# Patient Record
Sex: Female | Born: 1950 | Race: White | Hispanic: No | Smoking: Never smoker
Health system: Southern US, Community
[De-identification: ages and names within clinical notes are randomized; demographics above are authoritative.]

---

## 2018-04-20 ENCOUNTER — Other Ambulatory Visit: Payer: Self-pay | Admitting: Internal Medicine

## 2018-04-20 DIAGNOSIS — M810 Age-related osteoporosis without current pathological fracture: Secondary | ICD-10-CM

## 2018-06-21 ENCOUNTER — Other Ambulatory Visit: Payer: Self-pay

## 2019-03-29 ENCOUNTER — Other Ambulatory Visit: Payer: Self-pay | Admitting: Internal Medicine

## 2019-03-29 DIAGNOSIS — Z780 Asymptomatic menopausal state: Secondary | ICD-10-CM

## 2019-04-02 ENCOUNTER — Other Ambulatory Visit: Payer: Self-pay

## 2019-04-02 ENCOUNTER — Ambulatory Visit
Admission: RE | Admit: 2019-04-02 | Discharge: 2019-04-02 | Disposition: A | Discharge status: Home / Self Care | Payer: Self-pay | Source: Ambulatory Visit | Attending: Internal Medicine | Admitting: Internal Medicine

## 2019-04-02 DIAGNOSIS — Z780 Asymptomatic menopausal state: Secondary | ICD-10-CM

## 2019-07-22 ENCOUNTER — Other Ambulatory Visit: Payer: Self-pay

## 2019-07-22 ENCOUNTER — Emergency Department (HOSPITAL_COMMUNITY)
Admission: EM | Admit: 2019-07-22 | Discharge: 2019-07-22 | Disposition: A | Discharge status: Home / Self Care | Payer: Medicare HMO | Attending: Emergency Medicine | Admitting: Emergency Medicine

## 2019-07-22 DIAGNOSIS — H1131 Conjunctival hemorrhage, right eye: Secondary | ICD-10-CM | POA: Diagnosis not present

## 2019-07-22 DIAGNOSIS — H5789 Other specified disorders of eye and adnexa: Secondary | ICD-10-CM | POA: Diagnosis present

## 2019-07-22 NOTE — Discharge Instructions (Addendum)
You were evaluated in the Emergency Department and after careful evaluation, we did not find any emergent condition requiring admission or further testing in the hospital.  Your exam/testing today is overall reassuring.  Your symptoms seem to be due to a subconjunctival hemorrhage, which is not dangerous and will resolve on its own.  Please return to the Emergency Department if you experience any worsening of your condition.  We encourage you to follow up with a primary care provider.  Thank you for allowing [place] to be a part of your care.

## 2019-07-22 NOTE — ED Provider Notes (Signed)
[facility]-EMERGENCY DEPT [facility] Emergency Department Provider Note MRN:  [id number]  Arrival date & time: 07/22/19     Chief Complaint   right eye injury   History of Present Illness   Alexis Morrow is a 69 y.o. year-old female with no pertinent past medical history presenting to the ED with chief complaint of right eye injury.  Patient woke up from a nap and looked in the mirror and noticed that her right eye was red and bloody.  No vision loss, no eye pain, no fever, no trauma, no other complaints.  She is recovering from a tummy tuck procedure that she had done earlier today.  Mildly sore but in general doing well.  Review of Systems  A complete 10 system review of systems was obtained and all systems are negative except as noted in the HPI and PMH.   Patient's Health History   No past medical history on file.    No family history on file.  Social History   Socioeconomic History  . Marital status: Divorced    Spouse name: Not on file  . Number of children: Not on file  . Years of education: Not on file  . Highest education level: Not on file  Occupational History  . Not on file  Tobacco Use  . Smoking status: Not on file  Substance and Sexual Activity  . Alcohol use: Not on file  . Drug use: Not on file  . Sexual activity: Not on file  Other Topics Concern  . Not on file  Social History Narrative  . Not on file   Social Determinants of Health   Financial Resource Strain:   . Difficulty of Paying Living Expenses:   Food Insecurity:   . Worried About Programme researcher, broadcasting/film/video in the Last Year:   . Barista in the Last Year:   Transportation Needs:   . Freight forwarder (Medical):   Marland Kitchen Lack of Transportation (Non-Medical):   Physical Activity:   . Days of Exercise per Week:   . Minutes of Exercise per Session:   Stress:   . Feeling of Stress :   Social Connections:   . Frequency of Communication with Friends and Family:   . Frequency of Social  Gatherings with Friends and Family:   . Attends Religious Services:   . Active Member of Clubs or Organizations:   . Attends Banker Meetings:   Marland Kitchen Marital Status:   Intimate Partner Violence:   . Fear of Current or Ex-Partner:   . Emotionally Abused:   Marland Kitchen Physically Abused:   . Sexually Abused:      Physical Exam   Vitals:   07/22/19 1721  BP: (!) 154/94  Pulse: 97  Resp: 16  SpO2: 98%    CONSTITUTIONAL: Well-appearing, NAD NEURO:  Alert and oriented x 3, no focal deficits EYES:  eyes equal and reactive, normal visual acuity, medial aspect of right conjunctiva is dark red with hemorrhage ENT/NECK:  no LAD, no JVD CARDIO: Regular rate, well-perfused, normal S1 and S2 PULM:  CTAB no wheezing or rhonchi GI/GU:  normal bowel sounds, non-distended, non-tender MSK/SPINE:  No gross deformities, no edema SKIN:  no rash, atraumatic PSYCH:  Appropriate speech and behavior  *Additional and/or pertinent findings included in MDM below  Diagnostic and Interventional Summary    EKG Interpretation  Date/Time:    Ventricular Rate:    PR Interval:    QRS Duration:   QT Interval:  QTC Calculation:   R Axis:     Text Interpretation:        Labs Reviewed - No data to display  No orders to display    Medications - No data to display   Procedures  /  Critical Care Procedures  ED Course and Medical Decision Making  I have reviewed the triage vital signs, the nursing notes, and pertinent available records from the EMR.  Listed above are laboratory and imaging tests that I personally ordered, reviewed, and interpreted and then considered in my medical decision making (see below for details).      Exam consistent with uncomplicated some subconjunctival hemorrhage.  No vision loss, normal extraocular movements, reactive pupils.  Patient is also recovering from tummy tuck procedure earlier today, wound seems to be healing well, will be following up with her surgeon  within the next few days.  Appropriate for discharge.    Barth Kirks. Sedonia Small, [place] [email or web address]  Final Clinical Impressions(s) / ED Diagnoses     ICD-10-CM   1. Subconjunctival hemorrhage of right eye  H11.31     ED Discharge Orders    None       Discharge Instructions Discussed with and Provided to Patient:     Discharge Instructions     You were evaluated in the Emergency Department and after careful evaluation, we did not find any emergent condition requiring admission or further testing in the hospital.  Your exam/testing today is overall reassuring.  Your symptoms seem to be due to a subconjunctival hemorrhage, which is not dangerous and will resolve on its own.  Please return to the Emergency Department if you experience any worsening of your condition.  We encourage you to follow up with a primary care provider.  Thank you for allowing [place] to be a part of your care.      Maudie Flakes, MD 07/22/19 [phone]

## 2019-07-22 NOTE — ED Triage Notes (Signed)
Per patient, she looked in the mirror around 1700 and noticed her right eye was swollen.  Patient says eye is not bothering her and she can see fine.

## 2021-03-03 DIAGNOSIS — R070 Pain in throat: Secondary | ICD-10-CM | POA: Diagnosis not present

## 2021-03-03 DIAGNOSIS — K219 Gastro-esophageal reflux disease without esophagitis: Secondary | ICD-10-CM | POA: Diagnosis not present

## 2021-03-03 DIAGNOSIS — R053 Chronic cough: Secondary | ICD-10-CM | POA: Diagnosis not present

## 2021-06-11 DIAGNOSIS — H903 Sensorineural hearing loss, bilateral: Secondary | ICD-10-CM | POA: Diagnosis not present

## 2021-06-11 DIAGNOSIS — K219 Gastro-esophageal reflux disease without esophagitis: Secondary | ICD-10-CM | POA: Diagnosis not present

## 2021-06-11 DIAGNOSIS — R070 Pain in throat: Secondary | ICD-10-CM | POA: Diagnosis not present

## 2021-07-10 DIAGNOSIS — T8543XA Leakage of breast prosthesis and implant, initial encounter: Secondary | ICD-10-CM | POA: Diagnosis not present

## 2021-07-10 DIAGNOSIS — T8544XA Capsular contracture of breast implant, initial encounter: Secondary | ICD-10-CM | POA: Diagnosis not present

## 2021-07-10 DIAGNOSIS — F419 Anxiety disorder, unspecified: Secondary | ICD-10-CM | POA: Diagnosis not present

## 2021-07-10 DIAGNOSIS — L905 Scar conditions and fibrosis of skin: Secondary | ICD-10-CM | POA: Diagnosis not present

## 2021-07-10 DIAGNOSIS — Z9882 Breast implant status: Secondary | ICD-10-CM | POA: Diagnosis not present

## 2021-07-10 DIAGNOSIS — Z9013 Acquired absence of bilateral breasts and nipples: Secondary | ICD-10-CM | POA: Diagnosis not present

## 2021-07-10 DIAGNOSIS — N650 Deformity of reconstructed breast: Secondary | ICD-10-CM | POA: Diagnosis not present

## 2021-07-10 DIAGNOSIS — T8541XA Breakdown (mechanical) of breast prosthesis and implant, initial encounter: Secondary | ICD-10-CM | POA: Diagnosis not present

## 2021-07-10 DIAGNOSIS — Z853 Personal history of malignant neoplasm of breast: Secondary | ICD-10-CM | POA: Diagnosis not present

## 2021-10-01 DIAGNOSIS — R070 Pain in throat: Secondary | ICD-10-CM | POA: Diagnosis not present

## 2021-10-01 DIAGNOSIS — K219 Gastro-esophageal reflux disease without esophagitis: Secondary | ICD-10-CM | POA: Diagnosis not present

## 2021-11-03 DIAGNOSIS — Z1509 Genetic susceptibility to other malignant neoplasm: Secondary | ICD-10-CM | POA: Diagnosis not present

## 2021-11-03 DIAGNOSIS — F3342 Major depressive disorder, recurrent, in full remission: Secondary | ICD-10-CM | POA: Diagnosis not present

## 2021-11-03 DIAGNOSIS — E7800 Pure hypercholesterolemia, unspecified: Secondary | ICD-10-CM | POA: Diagnosis not present

## 2021-11-03 DIAGNOSIS — Z1501 Genetic susceptibility to malignant neoplasm of breast: Secondary | ICD-10-CM | POA: Diagnosis not present

## 2021-11-13 DIAGNOSIS — H40003 Preglaucoma, unspecified, bilateral: Secondary | ICD-10-CM | POA: Diagnosis not present

## 2021-11-13 DIAGNOSIS — H59812 Chorioretinal scars after surgery for detachment, left eye: Secondary | ICD-10-CM | POA: Diagnosis not present

## 2021-11-13 DIAGNOSIS — H02834 Dermatochalasis of left upper eyelid: Secondary | ICD-10-CM | POA: Diagnosis not present

## 2021-11-13 DIAGNOSIS — H02831 Dermatochalasis of right upper eyelid: Secondary | ICD-10-CM | POA: Diagnosis not present

## 2021-11-30 DIAGNOSIS — H02831 Dermatochalasis of right upper eyelid: Secondary | ICD-10-CM | POA: Diagnosis not present

## 2021-11-30 DIAGNOSIS — H02834 Dermatochalasis of left upper eyelid: Secondary | ICD-10-CM | POA: Diagnosis not present

## 2021-11-30 DIAGNOSIS — H40003 Preglaucoma, unspecified, bilateral: Secondary | ICD-10-CM | POA: Diagnosis not present

## 2021-12-04 ENCOUNTER — Emergency Department (HOSPITAL_BASED_OUTPATIENT_CLINIC_OR_DEPARTMENT_OTHER)
Admission: EM | Admit: 2021-12-04 | Discharge: 2021-12-04 | Disposition: A | Discharge status: Home / Self Care | Payer: Medicare (Managed Care) | Attending: Emergency Medicine | Admitting: Emergency Medicine

## 2021-12-04 ENCOUNTER — Other Ambulatory Visit: Payer: Self-pay

## 2021-12-04 ENCOUNTER — Other Ambulatory Visit (HOSPITAL_BASED_OUTPATIENT_CLINIC_OR_DEPARTMENT_OTHER): Payer: Self-pay

## 2021-12-04 ENCOUNTER — Encounter (HOSPITAL_BASED_OUTPATIENT_CLINIC_OR_DEPARTMENT_OTHER): Payer: Self-pay

## 2021-12-04 DIAGNOSIS — R42 Dizziness and giddiness: Secondary | ICD-10-CM | POA: Diagnosis not present

## 2021-12-04 LAB — CBC WITH DIFFERENTIAL/PLATELET
Abs Immature Granulocytes: 0.01 10*3/uL (ref 0.00–0.07)
Basophils Absolute: 0 10*3/uL (ref 0.0–0.1)
Basophils Relative: 1 %
Eosinophils Absolute: 0.2 10*3/uL (ref 0.0–0.5)
Eosinophils Relative: 4 %
HCT: 42.2 % (ref 36.0–46.0)
Hemoglobin: 14.4 g/dL (ref 12.0–15.0)
Immature Granulocytes: 0 %
Lymphocytes Relative: 25 %
Lymphs Abs: 1.3 10*3/uL (ref 0.7–4.0)
MCH: 30.6 pg (ref 26.0–34.0)
MCHC: 34.1 g/dL (ref 30.0–36.0)
MCV: 89.6 fL (ref 80.0–100.0)
Monocytes Absolute: 0.4 10*3/uL (ref 0.1–1.0)
Monocytes Relative: 8 %
Neutro Abs: 3.2 10*3/uL (ref 1.7–7.7)
Neutrophils Relative %: 62 %
Platelets: 272 10*3/uL (ref 150–400)
RBC: 4.71 MIL/uL (ref 3.87–5.11)
RDW: 12.7 % (ref 11.5–15.5)
WBC: 5.1 10*3/uL (ref 4.0–10.5)
nRBC: 0 % (ref 0.0–0.2)

## 2021-12-04 LAB — BASIC METABOLIC PANEL
Anion gap: 10 (ref 5–15)
BUN: 16 mg/dL (ref 8–23)
CO2: 24 mmol/L (ref 22–32)
Calcium: 9.9 mg/dL (ref 8.9–10.3)
Chloride: 104 mmol/L (ref 98–111)
Creatinine, Ser: 0.84 mg/dL (ref 0.44–1.00)
GFR, Estimated: >60 mL/min (ref >60)
Glucose, Bld: 98 mg/dL (ref 70–99)
Potassium: 4.1 mmol/L (ref 3.5–5.1)
Sodium: 138 mmol/L (ref 135–145)

## 2021-12-04 LAB — MAGNESIUM: Magnesium: 2 mg/dL (ref 1.7–2.4)

## 2021-12-04 NOTE — ED Triage Notes (Signed)
Pt states she has felt dizzy since Wednesday aprox 5:10 PM. States her blood pressure has been elevated since as well. Does not endorse medication for BP.

## 2021-12-04 NOTE — Discharge Instructions (Signed)
You were seen in the emergency department for your dizziness.  You had no signs of abnormal heart rhythms, your electrolytes and kidney function were normal, you are not anemic and had no signs of stroke on your exam.  It is unclear what is causing your dizziness at this time but you should follow-up with your primary doctor to have your symptoms rechecked.  You should return to the emergency department if your dizziness is getting worse and you pass out, you have chest pain with your dizziness, you have numbness or weakness on one side of the body compared to the other or if you have any other new or concerning symptoms.

## 2021-12-04 NOTE — ED Provider Notes (Signed)
[facility] EMERGENCY DEPT Provider Note   CSN: [id number] Arrival date & time: 12/04/21  1024     History  Chief Complaint  Patient presents with   Dizziness    Alexis Morrow is a 71 y.o. female.  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  The patient states that she was at yoga on Wednesday and when she change positions she became lightheaded and dizzy and her vision went black and she felt like she was about to pass out.  She states that she sat down for a few minutes and the symptoms eventually resolved.  She states that since then she has had on and off symptoms, mostly while up and ambulating.  She denies any associated chest pain or shortness of breath, numbness or tingling.  Denies any recent nausea, vomiting or diarrhea, black or bloody stools, fevers or chills, dysuria, hematuria or increased urinary frequency or urgency.  The history is provided by the patient.  Dizziness      Home Medications Prior to Admission medications   Not on File      Allergies    Patient has no allergy information on record.    Review of Systems   Review of Systems  Neurological:  Positive for dizziness.    Physical Exam Updated Vital Signs BP 132/81   Pulse 81   Temp 97.9 F (36.6 C) (Oral)   Resp 15   Ht 5\' 4"  (1.626 m)   Wt 69.9 kg   SpO2 97%   BMI 26.43 kg/m  Physical Exam Vitals and nursing note reviewed.  Constitutional:      General: She is not in acute distress.    Appearance: Normal appearance.  HENT:     Head: Normocephalic and atraumatic.     Nose: Nose normal.     Mouth/Throat:     Mouth: Mucous membranes are moist.     Pharynx: Oropharynx is clear.  Eyes:     Extraocular Movements: Extraocular movements intact.     Conjunctiva/sclera: Conjunctivae normal.     Pupils: Pupils are equal, round, and reactive to  light.     Comments: No nystagmus  Cardiovascular:     Rate and Rhythm: Normal rate and regular rhythm.     Pulses: Normal pulses.     Heart sounds: Normal heart sounds.  Pulmonary:     Effort: Pulmonary effort is normal.     Breath sounds: Normal breath sounds.  Abdominal:     General: Abdomen is flat.     Palpations: Abdomen is soft.     Tenderness: There is no abdominal tenderness.  Musculoskeletal:        General: Normal range of motion.     Cervical back: Normal range of motion and neck supple.     Right lower leg: No edema.     Left lower leg: No edema.  Skin:    General: Skin is warm and dry.  Neurological:     General: No focal deficit present.     Mental Status: She is alert and oriented to person, place, and time.     Cranial Nerves: No cranial nerve deficit.     Sensory: No sensory deficit.     Motor: No weakness.     Coordination: Coordination normal.  Psychiatric:        Mood and Affect: Mood normal.  ED Results / Procedures / Treatments   Labs (all labs ordered are listed, but only abnormal results are displayed) Labs Reviewed  BASIC METABOLIC PANEL  CBC WITH DIFFERENTIAL/PLATELET  MAGNESIUM    EKG EKG Interpretation  Date/Time:  Friday December 04 2021 11:26:35 EDT Ventricular Rate:  71 PR Interval:  163 QRS Duration: 96 QT Interval:  385 QTC Calculation: 419 R Axis:   40 Text Interpretation: Sinus rhythm RSR' in V1 or V2, right VCD or RVH No previous ECGs available Confirmed by Leanord Asal ([id number]) on 12/04/2021 12:06:51 PM  Radiology No results found.  Procedures Procedures    Medications Ordered in ED Medications - No data to display  ED Course/ Medical Decision Making/ A&P Clinical Course as of 12/04/21 1220  Fri Dec 04, 2021  1206 Labs within normal range, orthostatics negative [VK]    Clinical Course User Index [VK] Kemper Durie, DO                           Medical Decision Making This patient presents to the  ED with chief complaint(s) of dizziness with no pertinent past medical history which further complicates the presenting complaint. The complaint involves an extensive differential diagnosis and also carries with it a high risk of complications and morbidity.    The differential diagnosis includes arrhythmia, electrolyte abnormality, anemia, dehydration, orthostatics, no focal neurologic deficits making CVA unlikely  Additional history obtained: Additional history obtained from N/A Records reviewed Care Everywhere/External Records  ED Course and Reassessment: Patient is well-appearing on her arrival and hemodynamically stable.  She will have EKG, labs and orthostatics performed and will be closely reassessed.  Independent labs interpretation:  The following labs were independently interpreted: within normal range  Independent visualization of imaging: N/A  Consultation: - Consulted or discussed management/test interpretation w/ external professional: N/A  Consideration for admission or further workup: Patient has no emergent conditions requiring admission at this time and is stable for discharge home with primary care follow-up Social Determinants of health: N/A    Amount and/or Complexity of Data Reviewed Labs: ordered.          Final Clinical Impression(s) / ED Diagnoses Final diagnoses:  Dizziness    Rx / DC Orders ED Discharge Orders     None         Kemper Durie, DO 12/04/21 1220

## 2022-01-14 DIAGNOSIS — H02831 Dermatochalasis of right upper eyelid: Secondary | ICD-10-CM | POA: Diagnosis not present

## 2022-01-14 DIAGNOSIS — H02834 Dermatochalasis of left upper eyelid: Secondary | ICD-10-CM | POA: Diagnosis not present

## 2022-02-01 DIAGNOSIS — L987 Excessive and redundant skin and subcutaneous tissue: Secondary | ICD-10-CM | POA: Diagnosis not present

## 2022-02-01 DIAGNOSIS — H02834 Dermatochalasis of left upper eyelid: Secondary | ICD-10-CM | POA: Diagnosis not present

## 2022-02-01 DIAGNOSIS — H02831 Dermatochalasis of right upper eyelid: Secondary | ICD-10-CM | POA: Diagnosis not present

## 2022-02-15 DIAGNOSIS — F3342 Major depressive disorder, recurrent, in full remission: Secondary | ICD-10-CM | POA: Diagnosis not present

## 2022-02-15 DIAGNOSIS — Z0000 Encounter for general adult medical examination without abnormal findings: Secondary | ICD-10-CM | POA: Diagnosis not present

## 2022-02-15 DIAGNOSIS — Z79899 Other long term (current) drug therapy: Secondary | ICD-10-CM | POA: Diagnosis not present

## 2022-02-15 DIAGNOSIS — E7800 Pure hypercholesterolemia, unspecified: Secondary | ICD-10-CM | POA: Diagnosis not present

## 2022-02-15 DIAGNOSIS — R1310 Dysphagia, unspecified: Secondary | ICD-10-CM | POA: Diagnosis not present

## 2022-02-15 DIAGNOSIS — Z9013 Acquired absence of bilateral breasts and nipples: Secondary | ICD-10-CM | POA: Diagnosis not present

## 2022-02-15 DIAGNOSIS — L309 Dermatitis, unspecified: Secondary | ICD-10-CM | POA: Diagnosis not present

## 2022-02-15 DIAGNOSIS — E041 Nontoxic single thyroid nodule: Secondary | ICD-10-CM | POA: Diagnosis not present

## 2022-02-15 DIAGNOSIS — K449 Diaphragmatic hernia without obstruction or gangrene: Secondary | ICD-10-CM | POA: Diagnosis not present

## 2022-02-17 DIAGNOSIS — E7800 Pure hypercholesterolemia, unspecified: Secondary | ICD-10-CM | POA: Diagnosis not present

## 2022-02-17 DIAGNOSIS — Z79899 Other long term (current) drug therapy: Secondary | ICD-10-CM | POA: Diagnosis not present

## 2022-02-17 DIAGNOSIS — E041 Nontoxic single thyroid nodule: Secondary | ICD-10-CM | POA: Diagnosis not present

## 2022-02-17 DIAGNOSIS — R7301 Impaired fasting glucose: Secondary | ICD-10-CM | POA: Diagnosis not present

## 2022-02-18 ENCOUNTER — Other Ambulatory Visit (HOSPITAL_BASED_OUTPATIENT_CLINIC_OR_DEPARTMENT_OTHER): Payer: Self-pay | Admitting: Family Medicine

## 2022-02-18 DIAGNOSIS — E041 Nontoxic single thyroid nodule: Secondary | ICD-10-CM

## 2022-02-20 ENCOUNTER — Ambulatory Visit (HOSPITAL_BASED_OUTPATIENT_CLINIC_OR_DEPARTMENT_OTHER)
Admission: RE | Admit: 2022-02-20 | Discharge: 2022-02-20 | Disposition: A | Discharge status: Home / Self Care | Payer: Medicare (Managed Care) | Source: Ambulatory Visit | Attending: Family Medicine | Admitting: Family Medicine

## 2022-02-20 DIAGNOSIS — E041 Nontoxic single thyroid nodule: Secondary | ICD-10-CM | POA: Diagnosis not present

## 2022-05-11 DIAGNOSIS — R69 Illness, unspecified: Secondary | ICD-10-CM | POA: Diagnosis not present

## 2022-05-19 DIAGNOSIS — H6993 Unspecified Eustachian tube disorder, bilateral: Secondary | ICD-10-CM | POA: Diagnosis not present

## 2022-06-30 DIAGNOSIS — R69 Illness, unspecified: Secondary | ICD-10-CM | POA: Diagnosis not present

## 2022-08-16 DIAGNOSIS — R7301 Impaired fasting glucose: Secondary | ICD-10-CM | POA: Diagnosis not present

## 2022-09-15 DIAGNOSIS — R69 Illness, unspecified: Secondary | ICD-10-CM | POA: Diagnosis not present

## 2022-11-18 DIAGNOSIS — R69 Illness, unspecified: Secondary | ICD-10-CM | POA: Diagnosis not present

## 2022-11-25 DIAGNOSIS — R69 Illness, unspecified: Secondary | ICD-10-CM | POA: Diagnosis not present

## 2022-12-09 DIAGNOSIS — H40003 Preglaucoma, unspecified, bilateral: Secondary | ICD-10-CM | POA: Diagnosis not present

## 2022-12-09 DIAGNOSIS — H524 Presbyopia: Secondary | ICD-10-CM | POA: Diagnosis not present

## 2022-12-09 DIAGNOSIS — H59812 Chorioretinal scars after surgery for detachment, left eye: Secondary | ICD-10-CM | POA: Diagnosis not present

## 2022-12-09 DIAGNOSIS — H52223 Regular astigmatism, bilateral: Secondary | ICD-10-CM | POA: Diagnosis not present

## 2022-12-09 DIAGNOSIS — H5213 Myopia, bilateral: Secondary | ICD-10-CM | POA: Diagnosis not present

## 2023-02-10 DIAGNOSIS — R195 Other fecal abnormalities: Secondary | ICD-10-CM | POA: Diagnosis not present

## 2023-02-15 ENCOUNTER — Other Ambulatory Visit: Payer: Self-pay | Admitting: Family Medicine

## 2023-02-15 DIAGNOSIS — E2839 Other primary ovarian failure: Secondary | ICD-10-CM

## 2023-03-27 DIAGNOSIS — J189 Pneumonia, unspecified organism: Secondary | ICD-10-CM | POA: Diagnosis not present

## 2023-03-27 DIAGNOSIS — R051 Acute cough: Secondary | ICD-10-CM | POA: Diagnosis not present

## 2023-03-27 DIAGNOSIS — R062 Wheezing: Secondary | ICD-10-CM | POA: Diagnosis not present

## 2023-06-27 ENCOUNTER — Other Ambulatory Visit (HOSPITAL_COMMUNITY): Payer: Self-pay

## 2023-06-27 DIAGNOSIS — Z79899 Other long term (current) drug therapy: Secondary | ICD-10-CM | POA: Diagnosis not present

## 2023-06-27 DIAGNOSIS — Z13220 Encounter for screening for lipoid disorders: Secondary | ICD-10-CM | POA: Diagnosis not present

## 2023-06-27 DIAGNOSIS — R7303 Prediabetes: Secondary | ICD-10-CM | POA: Diagnosis not present

## 2023-06-27 DIAGNOSIS — Z136 Encounter for screening for cardiovascular disorders: Secondary | ICD-10-CM | POA: Diagnosis not present

## 2023-06-27 DIAGNOSIS — E041 Nontoxic single thyroid nodule: Secondary | ICD-10-CM | POA: Diagnosis not present

## 2023-06-27 MED ORDER — ESCITALOPRAM OXALATE 20 MG PO TABS
20.0000 mg | ORAL_TABLET | Freq: Every day | ORAL | 2 refills | Status: AC
  Filled 2023-06-27: qty 90, 90d supply, fill #0
  Filled 2023-09-29: qty 90, 90d supply, fill #1
  Filled 2023-12-29: qty 90, 90d supply, fill #2

## 2023-06-27 MED ORDER — PANTOPRAZOLE SODIUM 20 MG PO TBEC
20.0000 mg | DELAYED_RELEASE_TABLET | Freq: Every day | ORAL | 2 refills | Status: AC
  Filled 2023-06-27: qty 90, 90d supply, fill #0
  Filled 2023-09-29: qty 90, 90d supply, fill #1
  Filled 2023-12-29: qty 90, 90d supply, fill #2

## 2023-06-27 MED ORDER — TRAZODONE HCL 100 MG PO TABS
100.0000 mg | ORAL_TABLET | Freq: Every day | ORAL | 2 refills | Status: AC
  Filled 2023-06-27: qty 90, 90d supply, fill #0
  Filled 2023-09-29: qty 90, 90d supply, fill #1
  Filled 2023-12-29: qty 90, 90d supply, fill #2

## 2023-08-17 DIAGNOSIS — M25561 Pain in right knee: Secondary | ICD-10-CM | POA: Diagnosis not present

## 2023-09-20 DIAGNOSIS — E2839 Other primary ovarian failure: Secondary | ICD-10-CM | POA: Diagnosis not present

## 2023-09-20 DIAGNOSIS — Z9013 Acquired absence of bilateral breasts and nipples: Secondary | ICD-10-CM | POA: Diagnosis not present

## 2023-09-20 DIAGNOSIS — Z0000 Encounter for general adult medical examination without abnormal findings: Secondary | ICD-10-CM | POA: Diagnosis not present

## 2023-09-20 DIAGNOSIS — R7303 Prediabetes: Secondary | ICD-10-CM | POA: Diagnosis not present

## 2023-09-20 DIAGNOSIS — R1310 Dysphagia, unspecified: Secondary | ICD-10-CM | POA: Diagnosis not present

## 2023-09-20 DIAGNOSIS — E7800 Pure hypercholesterolemia, unspecified: Secondary | ICD-10-CM | POA: Diagnosis not present

## 2023-09-20 DIAGNOSIS — E041 Nontoxic single thyroid nodule: Secondary | ICD-10-CM | POA: Diagnosis not present

## 2023-09-20 DIAGNOSIS — K449 Diaphragmatic hernia without obstruction or gangrene: Secondary | ICD-10-CM | POA: Diagnosis not present

## 2023-09-20 DIAGNOSIS — F3342 Major depressive disorder, recurrent, in full remission: Secondary | ICD-10-CM | POA: Diagnosis not present

## 2023-09-23 ENCOUNTER — Other Ambulatory Visit: Payer: Self-pay | Admitting: Family Medicine

## 2023-09-23 DIAGNOSIS — K449 Diaphragmatic hernia without obstruction or gangrene: Secondary | ICD-10-CM

## 2023-09-27 ENCOUNTER — Ambulatory Visit
Admission: RE | Admit: 2023-09-27 | Discharge: 2023-09-27 | Disposition: A | Discharge status: Home / Self Care | Payer: Medicare (Managed Care) | Source: Ambulatory Visit | Attending: Family Medicine

## 2023-09-27 DIAGNOSIS — K449 Diaphragmatic hernia without obstruction or gangrene: Secondary | ICD-10-CM | POA: Diagnosis not present

## 2023-09-29 ENCOUNTER — Other Ambulatory Visit (HOSPITAL_COMMUNITY): Payer: Self-pay

## 2023-10-12 ENCOUNTER — Other Ambulatory Visit: Payer: Medicare (Managed Care)

## 2023-10-12 ENCOUNTER — Other Ambulatory Visit (HOSPITAL_BASED_OUTPATIENT_CLINIC_OR_DEPARTMENT_OTHER): Payer: Medicare (Managed Care)

## 2023-12-15 DIAGNOSIS — H524 Presbyopia: Secondary | ICD-10-CM | POA: Diagnosis not present

## 2023-12-15 DIAGNOSIS — H52201 Unspecified astigmatism, right eye: Secondary | ICD-10-CM | POA: Diagnosis not present

## 2023-12-15 DIAGNOSIS — H40003 Preglaucoma, unspecified, bilateral: Secondary | ICD-10-CM | POA: Diagnosis not present

## 2023-12-15 DIAGNOSIS — H59812 Chorioretinal scars after surgery for detachment, left eye: Secondary | ICD-10-CM | POA: Diagnosis not present

## 2023-12-15 DIAGNOSIS — H5213 Myopia, bilateral: Secondary | ICD-10-CM | POA: Diagnosis not present

## 2023-12-29 ENCOUNTER — Other Ambulatory Visit (HOSPITAL_COMMUNITY): Payer: Self-pay

## 2024-02-10 ENCOUNTER — Other Ambulatory Visit (HOSPITAL_COMMUNITY): Payer: Self-pay

## 2024-02-10 ENCOUNTER — Other Ambulatory Visit: Payer: Self-pay

## 2024-02-10 MED ORDER — FLUOCINONIDE 0.05 % EX CREA
TOPICAL_CREAM | CUTANEOUS | 3 refills | Status: AC
  Filled 2024-02-10: qty 60, 90d supply, fill #0
